# Patient Record
Sex: Male | Born: 1990 | Race: Black or African American | Hispanic: No | Marital: Single | Smoking: Never smoker
Health system: Southern US, Community
[De-identification: ages and names within clinical notes are randomized; demographics above are authoritative.]

---
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
## 2015-06-06 ENCOUNTER — Emergency Department (HOSPITAL_COMMUNITY)
Admission: EM | Admit: 2015-06-06 | Discharge: 2015-06-06 | Disposition: A | Discharge status: Home / Self Care | Payer: No Typology Code available for payment source | Attending: Emergency Medicine | Admitting: Emergency Medicine

## 2015-06-06 ENCOUNTER — Encounter (HOSPITAL_COMMUNITY): Payer: Self-pay | Admitting: Emergency Medicine

## 2015-06-06 DIAGNOSIS — Y9389 Activity, other specified: Secondary | ICD-10-CM | POA: Insufficient documentation

## 2015-06-06 DIAGNOSIS — Y92410 Unspecified street and highway as the place of occurrence of the external cause: Secondary | ICD-10-CM | POA: Diagnosis not present

## 2015-06-06 DIAGNOSIS — M545 Low back pain, unspecified: Secondary | ICD-10-CM

## 2015-06-06 DIAGNOSIS — Y998 Other external cause status: Secondary | ICD-10-CM | POA: Diagnosis not present

## 2015-06-06 DIAGNOSIS — S3992XA Unspecified injury of lower back, initial encounter: Secondary | ICD-10-CM | POA: Diagnosis not present

## 2015-06-06 MED ORDER — IBUPROFEN 600 MG PO TABS: 600.0000 mg | ORAL_TABLET | Freq: Four times a day (QID) | ORAL | Status: AC | PRN

## 2015-06-06 MED ORDER — METHOCARBAMOL 500 MG PO TABS: 500.0000 mg | ORAL_TABLET | Freq: Two times a day (BID) | ORAL | Status: AC
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
## 2015-06-30 ENCOUNTER — Emergency Department (HOSPITAL_COMMUNITY): Payer: Self-pay

## 2015-06-30 ENCOUNTER — Emergency Department (HOSPITAL_COMMUNITY)
Admission: EM | Admit: 2015-06-30 | Discharge: 2015-06-30 | Disposition: A | Discharge status: Home / Self Care | Payer: Self-pay | Attending: Emergency Medicine | Admitting: Emergency Medicine

## 2015-06-30 ENCOUNTER — Encounter (HOSPITAL_COMMUNITY): Payer: Self-pay | Admitting: Emergency Medicine

## 2015-06-30 DIAGNOSIS — N3943 Post-void dribbling: Secondary | ICD-10-CM | POA: Insufficient documentation

## 2015-06-30 DIAGNOSIS — Z202 Contact with and (suspected) exposure to infections with a predominantly sexual mode of transmission: Secondary | ICD-10-CM | POA: Insufficient documentation

## 2015-06-30 DIAGNOSIS — M545 Low back pain, unspecified: Secondary | ICD-10-CM

## 2015-06-30 LAB — URINE MICROSCOPIC-ADD ON

## 2015-06-30 LAB — URINALYSIS, ROUTINE W REFLEX MICROSCOPIC
BILIRUBIN URINE: NEGATIVE
Glucose, UA: NEGATIVE mg/dL
Hgb urine dipstick: NEGATIVE
KETONES UR: NEGATIVE mg/dL
NITRITE: NEGATIVE
PH: 6.5 (ref 5.0–8.0)
PROTEIN: NEGATIVE mg/dL
Specific Gravity, Urine: 1.023 (ref 1.005–1.030)
UROBILINOGEN UA: 1 mg/dL (ref 0.0–1.0)

## 2015-06-30 MED ORDER — CEPHALEXIN 500 MG PO CAPS
500.0000 mg | ORAL_CAPSULE | Freq: Once | ORAL | Status: AC
  Administered 2015-06-30: 500 mg via ORAL
  Filled 2015-06-30: qty 1

## 2015-06-30 MED ORDER — CEPHALEXIN 500 MG PO CAPS: 500.0000 mg | ORAL_CAPSULE | Freq: Four times a day (QID) | ORAL | Status: AC

## 2015-06-30 MED ORDER — NAPROXEN 500 MG PO TABS
500.0000 mg | ORAL_TABLET | Freq: Once | ORAL | Status: AC
  Administered 2015-06-30: 500 mg via ORAL
  Filled 2015-06-30: qty 1

## 2015-06-30 MED ORDER — NAPROXEN 500 MG PO TABS: 500.0000 mg | ORAL_TABLET | Freq: Two times a day (BID) | ORAL | Status: AC
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
## 2015-06-30 NOTE — Discharge Instructions (Signed)
Back Exercises   Follow-up with a primary care provider using the resource guide below. Take naproxen for back pain and Keflex for her UTI. Return for any numbness or weakness and your lower extremities. The hospital will call you if your STD results are positive. These exercises may help you when beginning to rehabilitate your injury. Your symptoms may resolve with or without further involvement from your physician, physical therapist or athletic trainer. While completing these exercises, remember:   Restoring tissue flexibility helps normal motion to return to the joints. This allows healthier, less painful movement and activity.  An effective stretch should be held for at least 30 seconds.  A stretch should never be painful. You should only feel a gentle lengthening or release in the stretched tissue. STRETCH - Extension, Prone on Elbows   Lie on your stomach on the floor, a bed will be too soft. Place your palms about shoulder width apart and at the height of your head.  Place your elbows under your shoulders. If this is too painful, stack pillows under your chest.  Allow your body to relax so that your hips drop lower and make contact more completely with the floor.  Hold this position for __________ seconds.  Slowly return to lying flat on the floor. Repeat __________ times. Complete this exercise __________ times per day.  RANGE OF MOTION - Extension, Prone Press Ups   Lie on your stomach on the floor, a bed will be too soft. Place your palms about shoulder width apart and at the height of your head.  Keeping your back as relaxed as possible, slowly straighten your elbows while keeping your hips on the floor. You may adjust the placement of your hands to maximize your comfort. As you gain motion, your hands will come more underneath your shoulders.  Hold this position __________ seconds.  Slowly return to lying flat on the floor. Repeat __________ times. Complete this exercise  __________ times per day.  RANGE OF MOTION- Quadruped, Neutral Spine   Assume a hands and knees position on a firm surface. Keep your hands under your shoulders and your knees under your hips. You may place padding under your knees for comfort.  Drop your head and point your tail bone toward the ground below you. This will round out your low back like an angry cat. Hold this position for __________ seconds.  Slowly lift your head and release your tail bone so that your back sags into a large arch, like an old horse.  Hold this position for __________ seconds.  Repeat this until you feel limber in your low back.  Now, find your "sweet spot." This will be the most comfortable position somewhere between the two previous positions. This is your neutral spine. Once you have found this position, tense your stomach muscles to support your low back.  Hold this position for __________ seconds. Repeat __________ times. Complete this exercise __________ times per day.  STRETCH - Flexion, Single Knee to Chest   Lie on a firm bed or floor with both legs extended in front of you.  Keeping one leg in contact with the floor, bring your opposite knee to your chest. Hold your leg in place by either grabbing behind your thigh or at your knee.  Pull until you feel a gentle stretch in your low back. Hold __________ seconds.  Slowly release your grasp and repeat the exercise with the opposite side. Repeat __________ times. Complete this exercise __________ times per day.  STRETCH - Hamstrings, Standing  Stand or sit and extend your right / left leg, placing your foot on a chair or foot stool  Keeping a slight arch in your low back and your hips straight forward.  Lead with your chest and lean forward at the waist until you feel a gentle stretch in the back of your right / left knee or thigh. (When done correctly, this exercise requires leaning only a small distance.)  Hold this position for __________  seconds. Repeat __________ times. Complete this stretch __________ times per day. STRENGTHENING - Deep Abdominals, Pelvic Tilt   Lie on a firm bed or floor. Keeping your legs in front of you, bend your knees so they are both pointed toward the ceiling and your feet are flat on the floor.  Tense your lower abdominal muscles to press your low back into the floor. This motion will rotate your pelvis so that your tail bone is scooping upwards rather than pointing at your feet or into the floor.  With a gentle tension and even breathing, hold this position for __________ seconds. Repeat __________ times. Complete this exercise __________ times per day.  STRENGTHENING - Abdominals, Crunches   Lie on a firm bed or floor. Keeping your legs in front of you, bend your knees so they are both pointed toward the ceiling and your feet are flat on the floor. Cross your arms over your chest.  Slightly tip your chin down without bending your neck.  Tense your abdominals and slowly lift your trunk high enough to just clear your shoulder blades. Lifting higher can put excessive stress on the low back and does not further strengthen your abdominal muscles.  Control your return to the starting position. Repeat __________ times. Complete this exercise __________ times per day.  STRENGTHENING - Quadruped, Opposite UE/LE Lift   Assume a hands and knees position on a firm surface. Keep your hands under your shoulders and your knees under your hips. You may place padding under your knees for comfort.  Find your neutral spine and gently tense your abdominal muscles so that you can maintain this position. Your shoulders and hips should form a rectangle that is parallel with the floor and is not twisted.  Keeping your trunk steady, lift your right hand no higher than your shoulder and then your left leg no higher than your hip. Make sure you are not holding your breath. Hold this position __________  seconds.  Continuing to keep your abdominal muscles tense and your back steady, slowly return to your starting position. Repeat with the opposite arm and leg. Repeat __________ times. Complete this exercise __________ times per day. Document Released: 10/07/2005 Document Revised: 12/12/2011 Document Reviewed: 01/01/2009 [facility] Patient Information 2015 [place], [place]. This information is not intended to replace advice given to you by your health care provider. Make sure you discuss any questions you have with your health care provider.  Emergency Department Resource Guide 1) Find a Doctor and Pay Out of Pocket Although you won't have to find out who is covered by your insurance plan, it is a good idea to ask around and get recommendations. You will then need to call the office and see if the doctor you have chosen will accept you as a new patient and what types of options they offer for patients who are self-pay. Some doctors offer discounts or will set up payment plans for their patients who do not have insurance, but you will need to ask so you  aren't surprised when you get to your appointment.  2) Contact Your Local Health Department Not all health departments have doctors that can see patients for sick visits, but many do, so it is worth a call to see if yours does. If you don't know where your local health department is, you can check in your phone book. The CDC also has a tool to help you locate your state's health department, and many state websites also have listings of all of their local health departments.  3) Find a Walk-in Clinic If your illness is not likely to be very severe or complicated, you may want to try a walk in clinic. These are popping up all over the country in pharmacies, drugstores, and shopping centers. They're usually staffed by nurse practitioners or physician assistants that have been trained to treat common illnesses and complaints. They're usually fairly quick and  inexpensive. However, if you have serious medical issues or chronic medical problems, these are probably not your best option.  No Primary Care Doctor: - Call Health Connect at  [phone] - they can help you locate a primary care doctor that  accepts your insurance, provides certain services, etc. - Physician Referral Service- [phone]  Chronic Pain Problems: Organization         Address  Phone   Notes  [facility]  [phone] Patients need to be referred by their primary care doctor.   Medication Assistance: Organization         Address  Phone   Notes  [facility] Medication [facility] [address] [place] [place], [place] [address] [phone] --Must be a resident of [facility] -- Must have NO insurance coverage whatsoever (no Medicaid/ Medicare, etc.) -- The pt. MUST have a primary care doctor that directs their care regularly and follows them in the community   [facility]  [phone]   [employer]  [phone]    Agencies that provide inexpensive medical care: Organization         Address  Phone   Notes  [facility]  [phone]   [facility]    [phone]   [facility] [address] [place], [place] [address] [phone]   [facility] [address], [place] [phone]   Planned Parenthood    [phone]   [facility]    [phone]   [facility]  [address], [place] Phone:  [phone], Fax:  [phone] Hours of Operation:  9 am - 6 pm, M-F.  Also accepts Medicaid/Medicare and self-pay.  [facility]  [address], [place] Phone: [phone], Fax: [phone]. Hours of Operation:  8:30 am - 5:30 pm, M-F.  Also accepts Medicaid and self-pay.  [facility] [place] [address], [place] Phone: [phone]   [facility] [address] Natasha Bence [place], [place] [phone], Ext. [phone] Mondays & Thursdays: 7-9 AM.  First 15 patients are seen on a first come, first serve basis.    Medicaid-accepting [facility] Providers:  Organization         Address  Phone   Notes  [facility] [address], [place] [phone] Also accepts self-pay patients.  [facility] [address] Laurell Josephs [place], [place]  [phone]  [facility] [address], [place] [phone]   [facility] [address], [place] [phone]   Renaye Rakers [address], [place]   [phone] Only accepts [place] Access [place] patients after they have their name applied to their card.   Self-Pay (no insurance) in [facility]:  Organization         Address  Phone   Notes  Sickle Cell Patients, [facility] [address] [place], [place] [phone]   [facility] [place] [facility] [address] [place], [place] [phone]   [facility]  [address], [place] [phone]   [facility]/Dr. Osei-Bonsu  [address] [place] or [address], [place] [phone] Phone number for both [place] and Cynai Skeens locations is the same.  [facility] [address], [place] [phone]   [facility] [address] [place] or [address] [phone]   [facility] [address], [place] [phone], phone; [phone], fax Sees patients 1st and 3rd Saturday of every month.  Must not qualify for public or private insurance (i.e. Medicaid, Medicare, [place] Health Choice, Veterans' Benefits)  Household income should be no more than 200% of the poverty level The clinic cannot treat you if you are pregnant or  think you are pregnant  Sexually transmitted diseases are not treated at the clinic.    Dental Care: Organization         Address  Phone  Notes  [facility] [address] [place], [place] [phone] Accepts children up to age 68 who are enrolled in [place] or [place] Health Choice; pregnant women with a Medicaid card; and children who have applied for Medicaid or [place] Health Choice, but were declined, whose parents can pay a reduced fee at time of service.  [facility]  [address], [place] [phone] Accepts children up to age 46 who are enrolled in [place] or [place] Health Choice; pregnant women with a Medicaid card; and children who have applied for Medicaid or [place] Health Choice, but were declined, whose parents can pay a reduced fee at time of service.  [facility]  [address] [place], [place] [phone] Patients are seen by appointment only. Walk-ins are not accepted. [facility] will see patients 57 years of age and older. Monday - Tuesday (8am-5pm) Most Wednesdays (8:30-5pm) $30 per visit, cash only  [facility] Adult Dental Access PRO[facility]  [address], [facility] [phone] Patients are seen by appointment only. Walk-ins are not accepted. [facility] will see patients 33 years of age and older. One Wednesday Evening (Monthly: Volunteer Based).  $30 per visit, cash only  [employer]  [phone] for adults; Children under age 98, call Graduate Pediatric Dentistry at [phone]. Children aged 34-14, please call [phone] to request a pediatric application.  Dental services are provided in all areas of dental care including fillings, crowns and bridges, complete and partial dentures, implants, gum treatment, root canals, and extractions. Preventive care is also provided. Treatment is provided to both adults  and children. Patients are selected via a lottery and there is often a waiting list.  [facility] [address], [place]inette Otto  [phone] [email or web address]   [facility] [address] [place], [place] [phone], Ext. [phone] Second and Fourth Thursday of each month, opens at 6:30 AM; Clinic ends at 9 AM.  Patients are seen on a first-come first-served basis, and a limited number are seen during each clinic.   [facility]  [address] Ether Griffins [place], [place] [phone]   Eligibility Requirements You must have lived in [place], [place], or [place] counties for at least the last three months.   You cannot be eligible for state or federal sponsored [place], including [employer], [place], or [employer].   You generally cannot be eligible for healthcare insurance through your employer.    How to apply: Eligibility screenings are held every Tuesday and Wednesday afternoon from 1:00 pm until 4:00 pm. You do not need an appointment for the interview!  [facility] [address], [place], [place] [phone]   [facility]  [phone]   [facility]  [phone]   [facility] Health [facility]  [phone]    Behavioral Health Resources in the Community: Intensive Outpatient Programs Organization         Address  Phone  Notes  [facility] [address], [place], [place] [phone]   [facility] Outpatient [address] [place], [place] [phone]   [facility] [address] [place], [place]  [phone]   [facility] [address],  [place], [place] [phone] or [phone]   Substance Abuse Resources Organization         Address  Phone  Notes  [facility]  [phone]   [facility]  [phone]   [facility] [place]  [phone]    Floydene Flock  [phone]   Residential & Outpatient Substance Abuse Program  [phone]   Psychological Services Organization         Address  Phone  Notes  [facility]  [phone]   [facility]  [phone]   [facility] [address] [place] [phone] or [phone]    Mobile Crisis Teams Organization         Address  Phone  Notes  [facility], Mobile Crisis Care Unit  [phone]   [facility]  [address] [place], [place] [phone]   Doristine Locks [address] [place] [phone]    Self-Help/Support Groups Organization         Address  Phone             Notes  [facility] [place] - variety of support groups  336- I7437963 Call for more information  Narcotics Anonymous (NA), [facility] [address], [employer]   2 meetings at this location   Statistician         Address  Phone  Notes  [facility] [address],    [place]  [phone]   [facility]  [address], [place] [address], [place], [place] [phone]   [facility] [address] [place], [place] [phone] Admissions: 8am-3pm M-F  [facility] [address]    [place], [place] [phone]   [facility] [address] [place], [place], [place] [phone]   [facility] [address],  [place], [place] [phone]   [facility] - Intensive Outpatient [address] Laurell Josephs [address], [place], [place] [phone]   [facility] (Addiction Recovery [facility] [address] [place]  [place], [place] 2-956-213-0865 or [phone]   [facility] ([facility]) [address] [place], [place] [phone] Accepts Medicaid  [facility] [address],  [place] [phone] Substance Abuse/Addiction Treatment   [facility] Organization         Address  Phone  Notes  [facility]  [phone]   Angie Fava, PhD [address] Ervin Knack [place], [place]   5738146255 or [phone]   [facility] Behavioral   [address] [place] [address] [place], [place] [phone]   [facility] [address], [place], [place] [phone] Insurance/Medicaid/sponsorship through [facility] [address]                                    [place], [place] [phone] Therapy/tele-psych/case  [facility] [address], [place] [phone]    Dr. Lolly Mustache  [phone]   [facility] [place]  [employer] [facility]. 1) [address] [place] 2) [address] [place] 3)  [address], [place] [phone]  [phone]   [facility] Child Abuse Hotline 986 163 9704 or [phone] (After Hours)
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
## 2015-07-01 LAB — GC/CHLAMYDIA PROBE AMP (~~LOC~~) NOT AT ARMC
CHLAMYDIA, DNA PROBE: POSITIVE — AB
Neisseria Gonorrhea: NEGATIVE

## 2015-07-01 LAB — HIV ANTIBODY (ROUTINE TESTING W REFLEX): HIV Screen 4th Generation wRfx: NONREACTIVE

## 2015-07-01 LAB — RPR: RPR: NONREACTIVE

## 2015-07-02 ENCOUNTER — Telehealth (HOSPITAL_BASED_OUTPATIENT_CLINIC_OR_DEPARTMENT_OTHER): Payer: Self-pay | Admitting: Emergency Medicine

## 2015-07-02 LAB — URINE CULTURE: Special Requests: NORMAL

## 2015-07-02 NOTE — Telephone Encounter (Signed)
Chart handoff to EDP for treatment plan for + Chlamydia 

## 2015-07-04 ENCOUNTER — Telehealth (HOSPITAL_BASED_OUTPATIENT_CLINIC_OR_DEPARTMENT_OTHER): Payer: Self-pay | Admitting: Emergency Medicine

## 2015-07-04 NOTE — Telephone Encounter (Signed)
Post ED Visit - Positive Culture Follow-up: Successful Patient Follow-Up  Culture assessed and recommendations reviewed by:  Celedonio Miyamoto, Pharm.D., BCPS-AQ ID  Georgina Pillion, Pharm.D., BCPS  [place], [address]D., BCPS, AAHIVP  Estella Husk, Pharm.D., BCPS, AAHIVP  [place], [address]D.  Tennis Must, Pharm.D.  Positive chlamydia culture   Patient discharged without antimicrobial prescription and treatment is now indicated  Organism is resistant to prescribed ED discharge antimicrobial  Patient with positive blood cultures  Changes discussed with ED provider: Dr. Tilden Fossa New antibiotic prescription Azithromycin 1 gram po once [facility] [address]  Contacted patient, 07/04/15 1112  Berle Mull 07/04/2015, 11:15 AM
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
